# Patient Record
Sex: Female | Born: 1935 | Race: White | Hispanic: No | Marital: Married
Health system: Southern US, Community
[De-identification: ages and names within clinical notes are randomized; demographics above are authoritative.]

---

## 1998-12-24 ENCOUNTER — Other Ambulatory Visit: Admission: RE | Admit: 1998-12-24 | Discharge: 1998-12-24 | Payer: Self-pay | Admitting: Gynecology

## 1999-01-02 ENCOUNTER — Other Ambulatory Visit: Admission: RE | Admit: 1999-01-02 | Discharge: 1999-01-02 | Payer: Self-pay | Admitting: Gynecology

## 2002-03-08 ENCOUNTER — Encounter: Payer: Self-pay | Admitting: Orthopedic Surgery

## 2002-03-08 ENCOUNTER — Encounter: Admission: RE | Admit: 2002-03-08 | Discharge: 2002-03-08 | Payer: Self-pay | Admitting: Orthopedic Surgery

## 2002-05-31 ENCOUNTER — Encounter: Payer: Self-pay | Admitting: Orthopedic Surgery

## 2002-06-06 ENCOUNTER — Encounter: Payer: Self-pay | Admitting: Orthopedic Surgery

## 2002-06-06 ENCOUNTER — Inpatient Hospital Stay (HOSPITAL_COMMUNITY): Admission: RE | Admit: 2002-06-06 | Discharge: 2002-06-09 | Payer: Self-pay | Admitting: Orthopedic Surgery

## 2002-06-09 ENCOUNTER — Inpatient Hospital Stay (HOSPITAL_COMMUNITY)
Admission: RE | Admit: 2002-06-09 | Discharge: 2002-06-15 | Payer: Self-pay | Admitting: Physical Medicine & Rehabilitation

## 2009-08-08 ENCOUNTER — Ambulatory Visit (HOSPITAL_BASED_OUTPATIENT_CLINIC_OR_DEPARTMENT_OTHER): Admission: RE | Admit: 2009-08-08 | Discharge: 2009-08-08 | Payer: Self-pay | Admitting: Orthopedic Surgery

## 2009-08-08 ENCOUNTER — Encounter (INDEPENDENT_AMBULATORY_CARE_PROVIDER_SITE_OTHER): Payer: Self-pay | Admitting: Orthopedic Surgery

## 2011-02-14 LAB — POCT I-STAT, CHEM 8
BUN: 27 mg/dL — ABNORMAL HIGH (ref 6–23)
Calcium, Ion: 1.19 mmol/L (ref 1.12–1.32)
Chloride: 98 mEq/L (ref 96–112)
Creatinine, Ser: 1 mg/dL (ref 0.4–1.2)
Glucose, Bld: 113 mg/dL — ABNORMAL HIGH (ref 70–99)
HCT: 37 % (ref 36.0–46.0)
Hemoglobin: 12.6 g/dL (ref 12.0–15.0)
Potassium: 4.7 mEq/L (ref 3.5–5.1)
Sodium: 129 mEq/L — ABNORMAL LOW (ref 135–145)
TCO2: 25 mmol/L (ref 0–100)

## 2011-03-28 NOTE — Op Note (Signed)
Erin Conner, Erin Conner                          ACCOUNT NO.:  [id number]   MEDICAL RECORD NO.:  [id number]                   PATIENT TYPE:  INP   LOCATION:  5039                                 FACILITY:  [facility]   PHYSICIAN:  Loreta Ave, M.D.              DATE OF BIRTH:  06-04-1926   DATE OF PROCEDURE:  06/06/2002  DATE OF DISCHARGE:  06/09/2002                                 OPERATIVE REPORT   PREOPERATIVE DIAGNOSIS:  End stage degenerative arthritis of right hip.   POSTOPERATIVE DIAGNOSIS:  End stage degenerative arthritis of right hip.   OPERATION:  Right total hip replacement, Osteonics prosthesis, Press-Fit 54  mm acetabular component with screw fixation times two and 10-degree  polyethylene insert, cemented #7 femoral component, Ion plus type, 127  degree neck angle, -3 x 28 mm head, 12 mm distal spacer.   SURGEON:  Loreta Ave, M.D.   ASSISTANT:  Arlys John D. Petrarca, P.A.-C.   ANESTHESIA:  General.   ESTIMATED BLOOD LOSS:  Less than 150 cc.   BLOOD REPLACED:  None.   SPECIMENS:  Excised bone and soft tissue.   CULTURES:  None.   COMPLICATIONS:  None.   DRESSING:  Self compressive with abduction pillow.   DESCRIPTION OF PROCEDURE:  The patient brought to the operating room and  placed on the operating table in supine position.  After adequate anesthesia  had been obtained, turned to a lateral position, right side up.  Prepped and  draped in usual sterile fashion.  Leg lengths assessed.  Right being 5 to 6  mm longer than the left.  Approached with a  longitudinal incision over the  femur.  Extending posterior, superior from the trochanter.  Skin and  subcutaneous tissue divided.  Hemostasis with cautery.  Iliotibial band  exposed and incised with Charnley retractor put in place.   Neurovascular structures identified, protected, retracted.  External rotator  and capsule taken down off the back of the intertrochanteric groove on the  femur.  Hip  dislocated.  Grade 4 changes throughout.  Femoral head removed  with a saw one finger breadth above the lesser trochanter in line with the  definitive component.  Acetabulum exposed.  Grade 4 changes.  Soft tissue  debrided.  Sequential reaming up to a nice bleeding bony bed.  Sized for a  54 mm acetabulum.  Hammered in place at 45 degrees of abduction and 20 of  anteversion.   Fixation augmented with a 20 mm screw above and a 16 mm screw  posterosuperior.  Polyethylene insert attached with a 10 degree overhang  placed posterosuperior.  Retractors removed.  Femur exposed.  Sequential  reaming with power and handheld reamers up to good fit both proximally and  distally for a #7 component.  With the #7 component in place restoring  normal anteversion and with 127 degree neck angle, the hip was reduced with  excellent stability and restoration of leg lengths with the -3 ball portion.   All trials removed.  Copious irrigation.  Cement restrictor placed 2 cm  distal to the femoral tip.  After a pulse lavage, cement prepared and placed  with pressurized technique down into the femur.  The femoral component  assembled with a 12 mm distal tip cemented at the base.  It was then pressed  down into the femoral shaft restoring normal anteversion and held in place  until the cement had hardened.  Once complete, a series of trials of femoral  head was then undertaken.  Chose the -3 x 28 mm head, which gave good  restoration of leg lengths, as well as excellent stability and flexion and  extension.   Definitive head was attached to the femur, and then it was reduced.  Wound  irrigated.  External rotator and capsule repaired to the back to the  intertrochanteric groove with #1 Ethibond passed through drill holes and  tied over a bony bridge.  Retractor removed.  Iliotibial band closed with #1  Vicryl, skin and subcutaneous tissue with Vicryl, and then staples.  Margin  of wound injected with Marcaine.   Sterile compressive dressing applied after  the patient had Marcaine injected at the margin of the wound.  Returned to  supine position.  Abduction pillow placed.  Anesthesia reversed.  Brought to  recovery room.  Tolerated surgery well.  No complications.                                               Loreta Ave, M.D.    DFM/MEDQ  D:  06/06/2002  T:  06/10/2002  Job:  [id number]   cc:   Loreta Ave, M.D.

## 2011-03-28 NOTE — Discharge Summary (Signed)
NAMEJENNYLEE, Erin Conner                          ACCOUNT NO.:  [id number]   MEDICAL RECORD NO.:  [id number]                   PATIENT TYPE:  IPS   LOCATION:  4155                                 FACILITY:  [facility]   PHYSICIAN:  Ranelle Oyster, M.D.             DATE OF BIRTH:  January 26, 1926   DATE OF ADMISSION:  06/09/2002  DATE OF DISCHARGE:  06/15/2002                                 DISCHARGE SUMMARY   ADMISSION DIAGNOSES:  Advanced degenerative joint disease of the right hip.   DISCHARGE DIAGNOSES:  1. Advanced degenerative joint disease of the right hip.  2. History of asthma.  3. History of hypertension.   PROCEDURE PERFORMED:  Right total hip replacement.   HISTORY OF PRESENT ILLNESS:  The patient is a very pleasant, 77 year old  female with persistent, unrelenting, right groin pain.  She is now having  pain to the point where she not only has pain with ambulation but also has  nighttime pain.  She has failed conservative treatment.  She is now  indicated for a right total hip replacement.   HOSPITAL COURSE:  The patient is a 77 year old female admitted June 06, 2002, after appropriate laboratory studies were obtained as well as 1 gm of  vancomycin IV on call to the operating room, she was taken to the operating  room where she underwent a right total hip replacement.  She tolerated the  procedure well.  She was placed on a PCA Dilaudid pump for postoperative  pain control.  Heparin 5000 units subcu q.12h. until the Coumadin became  therapeutic.  Consultations with physical therapy, occupational therapy and  rehabilitation medicine were made.  Physical therapy was consulted and  allowed for weightbearing as tolerated on the right.  Postoperative x-ray  was obtained of the right hip postoperative.  She had difficulty with  hypokalemia and her IV was changed to normal saline with 20 mEq of KCl.  Her  hydrochlorothiazide was discontinued on July 29 and she was fluid  restricted  to 1000 cc/day.  She also had a magnesium performed.  She improved over her  hospital course.  She was transferred to rehabilitation on July 31 where she  will continue with her physical therapy and occupational therapy.  She was  discharged in improved condition.  EKG showed normal sinus rhythm with sinus  arrhythmia, low-voltage QRS, septal infarction, age indeterminate.  This was  May 31, 2002.  Radiographic studies of June 06, 2002, revealed a right  total hip prosthesis without radiographic evidence of complicating feature.   LABORATORY STUDIES:  Preoperative hemoglobin 12.1, hematocrit 36.4%, white  blood cell count 11,800, platelet count 389,000.  Discharge hemoglobin 9.5,  hematocrit 28.7%.  Admission sodium was 131, potassium 4.2, chloride 94, CO2  26, glucose 101, BUN 12, creatinine 1.1, calcium 9.8, AST 33, ALT 30,  alkaline phosphatase 66, total bilirubin 0.4.  Discharge sodium 133,  potassium  3.8, chloride 96, CO2 28, glucose 147, BUN 9, creatinine 0.8,  calcium 9.0.  Magnesium level on July 29 was 1.3.  Urinalysis was benign for  a voided urine.  Blood type was A negative, antibody screen negative.   DISCHARGE INSTRUCTIONS:  The patient will be discharged to rehabilitation  where she will continue with her physical therapy and occupational therapy.    CONDITION ON DISCHARGE:  She is discharged in improved condition.   FOLLOWUP APPOINTMENTS:  The patient is to follow up back up in our office in  two weeks postoperatively.     Oris Drone Santiago Bumpers, P.A.-C.                Ranelle Oyster, M.D.    BDP/MEDQ  D:  07/17/2002  T:  07/18/2002  Job:  [id number]

## 2011-03-28 NOTE — Discharge Summary (Signed)
NAMEJEWELIA, BOCCHINO                          ACCOUNT NO.:  [id number]   MEDICAL RECORD NO.:  [id number]                   PATIENT TYPE:  IPS   LOCATION:  4155                                 FACILITY:  [facility]   PHYSICIAN:  Dian Situ, P.A.    DATE OF BIRTH:  1926-04-27   DATE OF ADMISSION:  06/09/2002  DATE OF DISCHARGE:  06/15/2002                                 DISCHARGE SUMMARY   DISCHARGE DIAGNOSES:  1. Total hip replacement.  2. Hypertension.  3. Hypernatremia, resolved.  4. Postoperative anemia.   HISTORY OF PRESENT ILLNESS:  Erin Conner is a 77 year old female with  hypertension and DJD of right hip with pain, elected to undergo right total  hip replacement 06/07/2002 by Dr. Eulah Pont.  Postop was weightbearing tolerated  and on Coumadin for DVT prophylaxis.  Issues past surgery  have included  postop anemia as well as worsening of chronic hypernatremia.  She was placed  on fluid restriction an Hydrochlorothiazide was being held.  Physical  therapy was initiated.  The patient currently close supervision for  transfers, close supervision for ambulating 20 feet with standard walker.  Rehabilitation consulted for progressive therapies.   PAST MEDICAL HISTORY:  Significant for hypertension, DJD, GERD,  appendectomy, asthma, insomnia, and excision of cataracts.   ALLERGIES:  PENICILLIN.   SOCIAL HISTORY:  The patient is married and lives in one-level home with one  to two steps at entry, was independent prior to admission.  She does not use  any alcohol or tobacco.  Has supportive family.   HOSPITAL COURSE:  Ms. Jamesha Ellsworth was admitted to rehabilitation on  06/09/2002 for inpatient therapies to consist of PT and OT.  Past admission,  she was maintained on Coumadin for DVT prophylaxis.  Coumadin at time of  discharge was therapeutic at 23.4 and 2.4, and patient to continue on  Coumadin through 06/17/2002 to complete a DVT prophylaxis course.  [facility]  to adjust and follow Coumadin past discharge.   Followup labs were done past admission showing hemoglobin 8.6, hematocrit  25.5.  Repeat showed some improvement with hemoglobin 9.4, hematocrit 28.2.  Sodium at time of admission was 132 on preoperative check.  As patient's  blood pressure was borderline, hydrochlorothiazide was resumed.  Followup  electrolytes on August 4 showed sodium 135, potassium 4.1, chloride 96, BUN  17, creatinine 0.8, glucose 176.  UA sent off was negative.  Urine cultures  showed insignificant growth of 6000 colonies.  The patient's right hip  incision healed well without any signs or symptoms of infection noted or  drainage or erythema noted.  Staples remained intact, and the patient to  follow up with Dr. Eulah Pont in five to seven days for staple removal.   During stay on rehabilitation, Ms. Testerman progressed to being modified  independent for transfers, modified independent to ambulate 150 feet on  level surfaces.  She is modified independent for navigating 12 steps.  The  patient was at modified independent level for advanced ADLs.  The patient  will continue to receive followup home health PT by [facility] past  discharge.  On 06/15/2002, the patient is discharged to home   DISCHARGE MEDICATIONS:  1. Coumadin 3 mg p.o. q.d.  2. Premarin 0.625 mg q.d.  3. Calcium 1 t.i.d.  4. Oxycodone IR 1 to 2 p.o. q.4-6h. p.r.n. pain.  5. Micardis 80 mg a day.  6. Hydrochlorothiazide 25 mg per day.   ACTIVITY:  Right total hip precautions, use walker.   WOUND CARE:  Keep clean and dry.   SPECIAL INSTRUCTIONS:  No alcohol, no smoking, no driving.  No aspirin or  aspirin products while on Coumadin.  Home health to monitor pro time on  06/17/2002.  [facility] to monitor Coumadin  through 07/07/2002.   FOLLOW UP:  The patient is to follow up with Dr. Eulah Pont in five to seven  days.  Follow up with Dr. Riley Kill as needed.                                                Dian Situ, P.A.    PP/MEDQ  D:  06/15/2002  T:  06/20/2002  Job:  [id number]   cc:   Gwen Pounds, M.D.   Loreta Ave, M.D.

## 2013-09-08 ENCOUNTER — Telehealth: Payer: Self-pay | Admitting: Oncology

## 2013-09-08 NOTE — Telephone Encounter (Signed)
C/D 09/08/13 for appt. 09/27/13

## 2013-09-23 ENCOUNTER — Other Ambulatory Visit: Payer: Self-pay | Admitting: Oncology

## 2013-09-23 DIAGNOSIS — D729 Disorder of white blood cells, unspecified: Secondary | ICD-10-CM

## 2013-09-27 ENCOUNTER — Ambulatory Visit: Payer: Medicare Other

## 2013-09-27 ENCOUNTER — Ambulatory Visit (HOSPITAL_COMMUNITY)
Admission: RE | Admit: 2013-09-27 | Discharge: 2013-09-27 | Disposition: A | Discharge status: Home / Self Care | Payer: Medicare Other | Source: Ambulatory Visit | Attending: Oncology | Admitting: Oncology

## 2013-09-27 ENCOUNTER — Other Ambulatory Visit (HOSPITAL_BASED_OUTPATIENT_CLINIC_OR_DEPARTMENT_OTHER): Payer: Medicare Other | Admitting: Lab

## 2013-09-27 ENCOUNTER — Encounter (INDEPENDENT_AMBULATORY_CARE_PROVIDER_SITE_OTHER): Payer: Self-pay

## 2013-09-27 ENCOUNTER — Telehealth: Payer: Self-pay | Admitting: Oncology

## 2013-09-27 ENCOUNTER — Ambulatory Visit (HOSPITAL_BASED_OUTPATIENT_CLINIC_OR_DEPARTMENT_OTHER): Payer: Medicare Other | Admitting: Oncology

## 2013-09-27 VITALS — BP 132/59 | HR 82 | Temp 97.0°F | Resp 20 | Ht 66.0 in | Wt 177.7 lb

## 2013-09-27 DIAGNOSIS — D7289 Other specified disorders of white blood cells: Secondary | ICD-10-CM

## 2013-09-27 DIAGNOSIS — D472 Monoclonal gammopathy: Secondary | ICD-10-CM

## 2013-09-27 DIAGNOSIS — M47812 Spondylosis without myelopathy or radiculopathy, cervical region: Secondary | ICD-10-CM | POA: Insufficient documentation

## 2013-09-27 DIAGNOSIS — N289 Disorder of kidney and ureter, unspecified: Secondary | ICD-10-CM

## 2013-09-27 DIAGNOSIS — I6529 Occlusion and stenosis of unspecified carotid artery: Secondary | ICD-10-CM | POA: Insufficient documentation

## 2013-09-27 DIAGNOSIS — M47817 Spondylosis without myelopathy or radiculopathy, lumbosacral region: Secondary | ICD-10-CM | POA: Insufficient documentation

## 2013-09-27 DIAGNOSIS — I658 Occlusion and stenosis of other precerebral arteries: Secondary | ICD-10-CM | POA: Insufficient documentation

## 2013-09-27 DIAGNOSIS — D729 Disorder of white blood cells, unspecified: Secondary | ICD-10-CM

## 2013-09-27 LAB — CBC WITH DIFFERENTIAL/PLATELET
BASO%: 1.7 % (ref 0.0–2.0)
EOS%: 5.6 % (ref 0.0–7.0)
HCT: 35.8 % (ref 34.8–46.6)
MCHC: 33.2 g/dL (ref 31.5–36.0)
MONO#: 1.5 10*3/uL — ABNORMAL HIGH (ref 0.1–0.9)
NEUT#: 7.2 10*3/uL — ABNORMAL HIGH (ref 1.5–6.5)
NEUT%: 61.1 % (ref 38.4–76.8)
Platelets: 321 10*3/uL (ref 145–400)
RBC: 3.88 10*6/uL (ref 3.70–5.45)
RDW: 13.3 % (ref 11.2–14.5)
WBC: 11.8 10*3/uL — ABNORMAL HIGH (ref 3.9–10.3)
lymph#: 2.3 10*3/uL (ref 0.9–3.3)

## 2013-09-27 LAB — COMPREHENSIVE METABOLIC PANEL (CC13)
ALT: 14 U/L (ref 0–55)
AST: 18 U/L (ref 5–34)
Albumin: 3.1 g/dL — ABNORMAL LOW (ref 3.5–5.0)
Alkaline Phosphatase: 112 U/L (ref 40–150)
Anion Gap: 10 mEq/L (ref 3–11)
CO2: 23 mEq/L (ref 22–29)
Calcium: 10.4 mg/dL (ref 8.4–10.4)
Chloride: 104 mEq/L (ref 98–109)
Potassium: 4 mEq/L (ref 3.5–5.1)
Sodium: 137 mEq/L (ref 136–145)
Total Protein: 7.3 g/dL (ref 6.4–8.3)

## 2013-09-27 NOTE — Progress Notes (Signed)
Note dictated

## 2013-09-27 NOTE — Telephone Encounter (Signed)
Appts made per 11/18 POF AVS and CAL to pt shh

## 2013-09-30 LAB — SPEP & IFE WITH QIG
Alpha-1-Globulin: 5.5 % — ABNORMAL HIGH (ref 2.9–4.9)
Alpha-2-Globulin: 14.1 % — ABNORMAL HIGH (ref 7.1–11.8)
Beta 2: 5 % (ref 3.2–6.5)
Gamma Globulin: 13.5 % (ref 11.1–18.8)
Total Protein, Serum Electrophoresis: 6.4 g/dL (ref 6.0–8.3)

## 2013-09-30 LAB — KAPPA/LAMBDA LIGHT CHAINS: Kappa:Lambda Ratio: 1.24 (ref 0.26–1.65)

## 2013-10-18 NOTE — Progress Notes (Signed)
CC:   Erin Conner. Erin Conner, Fax [phone] Erin Conner. Erin Conner, M.D.  REFERRING PHYSICIAN:  James L. Erin Conner, M.D.  REASON FOR CONSULTATION:  Evaluation for plasma cell disorder.  HISTORY OF PRESENT ILLNESS:  This is a pleasant 79 year old woman, native of [place] [facility], where she lived the majority of her life. She is currently retired and lives with her husband and continued to be in relatively good health.  She does have significant past medical history significant for longstanding hypertension and coronary artery disease, but despite that, she has not had any recent decline in her health.  She was noted by her primary care physician, increase in her creatinine from her baseline to creatinine ranged between 1.8 to 2.1. She was referred to Dr. Fayrene Fearing Erin Conner at [facility] and his workup included serum protein electrophoresis, which showed an M- spike that is 0.2 g/dL.  She had normal quantitative immunoglobulins of IgG, IgA, and IgM.  Immunofixation showed an IgG lambda.  Her protein urine electrophoresis showed no M-spike.  Her iron studies all within normal range.  Her calcium was slightly elevated at 10.9.  Her total protein was normal at 7, albumin was normal at 4.0, and her creatinine was 2.13 with her electronic GFR at 21 mL/min.  For that reason, the patient was referred to me for evaluation for possible plasma cell disorder.  Clinically, she is asymptomatic.  She does not report any opportunistic infections, does not report any pathological fractures, does not report any peripheral neuropathy.  She does not report any neurological symptoms, did not report any headaches, blurred vision, double vision, alteration of mental status, psychiatric issues, depression.  Does not report any weight loss, appetite changes, or any constitutional symptoms.  Does not report any chest pain, orthopnea, PND, or palpitation.  Does not report any cough, hemoptysis  or hematemesis.  Does not report any nausea, vomiting, or abdominal pain. Does not report any constipation or diarrhea.  Does not report any frequency, urgency, or hesitancy.  Does not report any musculoskeletal complaints.  No arthralgias, myalgias.  No heat or cold intolerance.  No bleeding, clotting diathesis.  Rest of review of systems is unremarkable.  PAST MEDICAL HISTORY:  Significant for hypertension, history of atrial fibrillation, coronary artery disease, and renal insufficiency.  She has history of hyperlipidemia.  MEDICATIONS:  She is on amlodipine, Eliquis, aspirin, atenolol, furosemide, levothyroxine, losartan, montelukast, pantoprazole, potassium supplement, fish oil, multivitamin, Nasonex, and Dulera.  ALLERGIES:  Allergies to penicillin.  She also had adverse reactions to Restoril and Ambien.  SOCIAL HISTORY:  She is married.  She has a daughter.  Denied any alcohol or tobacco abuse.  Currently retired.  FAMILY HISTORY:  Does not report any history of any blood disorders or any malignancies.  PAST SURGICAL HISTORY:  She is status post hysterectomy, cholecystectomy, and hip replacement among other orthopedic procedures as she had in the past.  PHYSICAL EXAMINATION:  General:  Alert, awake, pleasant woman, appeared in no active distress.  Vital Signs:  Blood pressure is 132/59, pulse 82, respirations 20, temperature is 97, weight is 177 pounds.  HEENT: Head is normocephalic, atraumatic.  Pupils are equal, round, and reactive to light.  Oral mucosa moist and pink.  Neck:  Supple without lymphadenopathy.  Heart:  Regular rate, S1 and S2.  Lungs:  Clear to auscultation.  No rhonchi, wheeze, dullness to percussion.  Abdomen: Soft, nontender.  No hepatosplenomegaly.  Extremities:  No clubbing, cyanosis, or edema.  Neurologic:  Intact motor,  sensory and deep tendon reflexes.  LABORATORY DATA:  Showed a hemoglobin of 11.9, white cell count of 11.8, platelet count 321.   Chemistries and serum protein electrophoresis are currently pending.  ASSESSMENT AND PLAN:  This is a pleasant 79 year old woman with the following issues: 1. IgG lambda monoclonal protein, presented with an M-spike of 0.2     g/dL with normal quantitative immunoglobulins.  Her urine     electrophoresis did not report any abnormalities and her serum     light chains are pending from today.  The differential diagnosis     was discussed today with the patient and her daughter that would     include a plasma cell disorder such as monoclonal gammopathy of     undetermined significance, multiple myeloma, and less likely     amyloidosis.  I see no clear-cut end organ damage at this time.  It     is very possible that she could have lytic bony lesions and for     that reason, I am obtaining a skeletal survey to rule that out.  If     that is negative, then probably we are dealing with a monoclonal     gammopathy of undetermined significance rather than multiple     myeloma.  I have explained to her the natural course of this     disease and possible need for continued observation.  Clearly if     she has bone lesions, then she will require a bone marrow biopsy     and evaluation for treatment of her multiple myeloma.  I feel that     her M-spike is rather low and is probably an incidental finding. 2. Renal insufficiency.  I doubt this is related to plasma cell     disorder, longstanding hypertension and nephrosclerosis.  She is     following up with Nephrology regarding that.  All her questions     were answered today.    ______________________________ Benjiman Core, M.D. FNS/MEDQ  D:  09/27/2013  T:  09/28/2013  Job:  [id number]

## 2014-03-28 ENCOUNTER — Other Ambulatory Visit: Payer: Medicare Other

## 2014-03-28 ENCOUNTER — Ambulatory Visit: Payer: Medicare Other | Admitting: Oncology

## 2014-04-19 ENCOUNTER — Telehealth: Payer: Self-pay | Admitting: Oncology

## 2014-04-19 NOTE — Telephone Encounter (Signed)
received vm from gloria at dr deterding's office requesting appt for pt. pt missed 5/19 appts. r/s appts and s/w pt today re appts for 6/17 and mailed schedule. intiilally called cell due to home number busy. lm on cell but was able to reach pt at home re appt.

## 2014-04-26 ENCOUNTER — Other Ambulatory Visit (HOSPITAL_BASED_OUTPATIENT_CLINIC_OR_DEPARTMENT_OTHER): Payer: Medicare Other

## 2014-04-26 ENCOUNTER — Ambulatory Visit (HOSPITAL_BASED_OUTPATIENT_CLINIC_OR_DEPARTMENT_OTHER): Payer: Medicare Other | Admitting: Oncology

## 2014-04-26 ENCOUNTER — Telehealth: Payer: Self-pay | Admitting: Oncology

## 2014-04-26 ENCOUNTER — Encounter: Payer: Self-pay | Admitting: Oncology

## 2014-04-26 VITALS — BP 153/50 | HR 71 | Temp 97.8°F | Resp 18 | Ht 66.0 in | Wt 167.1 lb

## 2014-04-26 DIAGNOSIS — D7289 Other specified disorders of white blood cells: Secondary | ICD-10-CM

## 2014-04-26 DIAGNOSIS — N189 Chronic kidney disease, unspecified: Secondary | ICD-10-CM

## 2014-04-26 DIAGNOSIS — D472 Monoclonal gammopathy: Secondary | ICD-10-CM

## 2014-04-26 DIAGNOSIS — D729 Disorder of white blood cells, unspecified: Secondary | ICD-10-CM

## 2014-04-26 LAB — COMPREHENSIVE METABOLIC PANEL (CC13)
ALT: 15 U/L (ref 0–55)
ANION GAP: 10 meq/L (ref 3–11)
AST: 22 U/L (ref 5–34)
Albumin: 3.1 g/dL — ABNORMAL LOW (ref 3.5–5.0)
Alkaline Phosphatase: 113 U/L (ref 40–150)
BUN: 31 mg/dL — ABNORMAL HIGH (ref 7.0–26.0)
CALCIUM: 10.4 mg/dL (ref 8.4–10.4)
CO2: 23 meq/L (ref 22–29)
CREATININE: 1.7 mg/dL — AB (ref 0.6–1.1)
Chloride: 109 mEq/L (ref 98–109)
GLUCOSE: 165 mg/dL — AB (ref 70–140)
Potassium: 4.1 mEq/L (ref 3.5–5.1)
Sodium: 142 mEq/L (ref 136–145)
Total Bilirubin: 0.52 mg/dL (ref 0.20–1.20)
Total Protein: 6.8 g/dL (ref 6.4–8.3)

## 2014-04-26 LAB — CBC WITH DIFFERENTIAL/PLATELET
BASO%: 1.5 % (ref 0.0–2.0)
BASOS ABS: 0.1 10*3/uL (ref 0.0–0.1)
EOS ABS: 0.8 10*3/uL — AB (ref 0.0–0.5)
EOS%: 8.2 % — ABNORMAL HIGH (ref 0.0–7.0)
HEMATOCRIT: 40.1 % (ref 34.8–46.6)
HEMOGLOBIN: 12.8 g/dL (ref 11.6–15.9)
LYMPH#: 1.8 10*3/uL (ref 0.9–3.3)
LYMPH%: 18.5 % (ref 14.0–49.7)
MCH: 29.3 pg (ref 25.1–34.0)
MCHC: 32.1 g/dL (ref 31.5–36.0)
MCV: 91.6 fL (ref 79.5–101.0)
MONO#: 1 10*3/uL — AB (ref 0.1–0.9)
MONO%: 10.7 % (ref 0.0–14.0)
NEUT#: 5.8 10*3/uL (ref 1.5–6.5)
NEUT%: 61.1 % (ref 38.4–76.8)
Platelets: 262 10*3/uL (ref 145–400)
RBC: 4.37 10*6/uL (ref 3.70–5.45)
RDW: 14.1 % (ref 11.2–14.5)
WBC: 9.5 10*3/uL (ref 3.9–10.3)

## 2014-04-26 NOTE — Progress Notes (Signed)
Hematology and Oncology Follow Up Visit  Erin Conner [id number] 1926-05-01 80 y.o. 04/26/2014 10:30 AM Erin Conner, Erin Conner, [place]   Principle Diagnosis: 80 year old woman with a monoclonal lambda light chain with a possible plasma cell disorder likely MGUS diagnosed in November of 2014.   Current therapy: Observation and surveillance.  Interim History:  Erin Conner presents today for a followup visit. He is a pleasant woman I saw in consultation back in November of 2014 for the evaluation for a possible plasma cell disorder. Her workup revealed a monoclonal lambda light chain without any heavy chain complement. Her M spike was not detected and her serum light chains showed an elevated kappa and lambda complement with a normal ratio. Her skeletal survey did not show any lytic bony lesions. She does have chronic renal insufficiency with a creatinine currently at baseline. He is asymptomatic at this point but she was hospitalized in May of 2015 for a pneumonia and urinary tract infection. Currently however she is asymptomatic. She does not report any headaches or blurry vision or double vision. Does not report any fevers or chills or sweats. This report any chest pain shortness of breath or cough or hemoptysis. Does not report any bone lesions or pathological fractures. Does not report any recurrent infections. Percent review of systems unremarkable.  Medications: I have reviewed the patient's current medications.  Current Outpatient Prescriptions  Medication Sig Dispense Refill  . apixaban (ELIQUIS) 2.5 MG TABS tablet Take 2.5 mg by mouth 2 (two) times daily.      Marland Kitchen aspirin 81 MG tablet Take 81 mg by mouth daily.      Marland Kitchen atenolol (TENORMIN) 25 MG tablet Take 12.5 mg by mouth daily.      Marland Kitchen desloratadine (CLARINEX) 5 MG tablet Take 5 mg by mouth daily.      Marland Kitchen donepezil (ARICEPT) 5 MG tablet Take 5 mg by mouth at bedtime.      . fish oil-omega-3 fatty acids 1000 MG capsule Take 1,200 mg by mouth  daily.      Marland Kitchen levothyroxine (SYNTHROID, LEVOTHROID) 75 MCG tablet Take 75 mcg by mouth daily before breakfast.      . losartan (COZAAR) 100 MG tablet Take 50 mg by mouth daily.       . magnesium oxide (MAG-OX) 400 MG tablet Take 400 mg by mouth daily.      . montelukast (SINGULAIR) 10 MG tablet Take 10 mg by mouth at bedtime.      . pantoprazole (PROTONIX) 40 MG tablet Take 40 mg by mouth 2 (two) times daily.       . potassium chloride (K-DUR) 10 MEQ tablet Take 10 mEq by mouth daily.       No current facility-administered medications for this visit.     Allergies:  Allergies  Allergen Reactions  . Ambien [Zolpidem]   . Penicillins   . Restoril [Temazepam]     Past Medical History, Surgical history, Social history, and Family History were reviewed and updated.   Physical Exam: Blood pressure 153/50, pulse 71, temperature 97.8 F (36.6 C), temperature source Oral, resp. rate 18, height 5\' 6"  (1.676 m), weight 167 lb 1.6 oz (75.796 kg). ECOG: 2 General appearance: alert Head: Normocephalic, without obvious abnormality Neck: no adenopathy Lymph nodes: Cervical, supraclavicular, and axillary nodes normal. Heart:regular rate and rhythm, S1, S2 normal, no murmur, click, rub or gallop Lung:chest clear, no wheezing, rales, normal symmetric air entry Abdomin: soft, non-tender, without masses or organomegaly EXT:no erythema, induration, or  nodules   Lab Results: Lab Results  Component Value Date   WBC 9.5 04/26/2014   HGB 12.8 04/26/2014   HCT 40.1 04/26/2014   MCV 91.6 04/26/2014   PLT 262 04/26/2014     Chemistry      Component Value Date/Time   NA 142 04/26/2014 0954   NA 129* 08/08/2009 0814   K 4.1 04/26/2014 0954   K 4.7 08/08/2009 0814   CL 98 08/08/2009 0814   CO2 23 04/26/2014 0954   BUN 31.0* 04/26/2014 0954   BUN 27* 08/08/2009 0814   CREATININE 1.7* 04/26/2014 0954   CREATININE 1.0 08/08/2009 0814      Component Value Date/Time   CALCIUM 10.4 04/26/2014 0954   ALKPHOS  113 04/26/2014 0954   AST 22 04/26/2014 0954   ALT 15 04/26/2014 0954   BILITOT 0.52 04/26/2014 0954       Impression and Plan:  80 year old woman with the following issues:  1. A plasma cell disorder possible MGUS manifesting itself with light chain disease detected by immunofixation. Her total kappa and lambda light chains are elevated with a normal ratio. She does have an elevated lambda light chain with immunofixation. I see no clear-cut evidence of end organ damage. She has a normal hemoglobin, normal skeletal survey and her creatinine is stable. I plan to continue active surveillance and repeat these studies in one year.  2. Chronic renal insufficiency: Her creatinine today is 1.7 which is improved from 2.0 back in 2014. I doubt her chronic renal insufficiency is related to a plasma cell disorder but will continue to monitor her clinical picture.  3. Followup: Will be in 12 months sooner if needed.    [facility], MD 6/17/201510:30 AM

## 2014-04-26 NOTE — Telephone Encounter (Signed)
gv and printed appt sched and avs for pt for June 2016 °

## 2014-04-28 LAB — SPEP & IFE WITH QIG
ALPHA-1-GLOBULIN: 5.3 % — AB (ref 2.9–4.9)
Albumin ELP: 55.7 % — ABNORMAL LOW (ref 55.8–66.1)
Alpha-2-Globulin: 11.9 % — ABNORMAL HIGH (ref 7.1–11.8)
BETA 2: 5 % (ref 3.2–6.5)
Beta Globulin: 7.4 % — ABNORMAL HIGH (ref 4.7–7.2)
GAMMA GLOBULIN: 14.7 % (ref 11.1–18.8)
IGA: 248 mg/dL (ref 69–380)
IGM, SERUM: 54 mg/dL (ref 52–322)
IgG (Immunoglobin G), Serum: 842 mg/dL (ref 690–1700)
Total Protein, Serum Electrophoresis: 6.2 g/dL (ref 6.0–8.3)

## 2014-04-28 LAB — KAPPA/LAMBDA LIGHT CHAINS
Kappa free light chain: 8.12 mg/dL — ABNORMAL HIGH (ref 0.33–1.94)
Kappa:Lambda Ratio: 1.99 — ABNORMAL HIGH (ref 0.26–1.65)
Lambda Free Lght Chn: 4.09 mg/dL — ABNORMAL HIGH (ref 0.57–2.63)

## 2015-04-25 ENCOUNTER — Other Ambulatory Visit: Payer: Medicare Other

## 2015-04-25 ENCOUNTER — Ambulatory Visit: Payer: Medicare Other | Admitting: Oncology

## 2015-07-02 IMAGING — CR DG BONE SURVEY MET
8 of 10 series · 8 of 10 positions shown · non-contrast
Comparison: None.

CLINICAL DATA: Monoclonal gammopathy of unknown significance.

EXAM:
METASTATIC BONE SURVEY

[w chest pa]
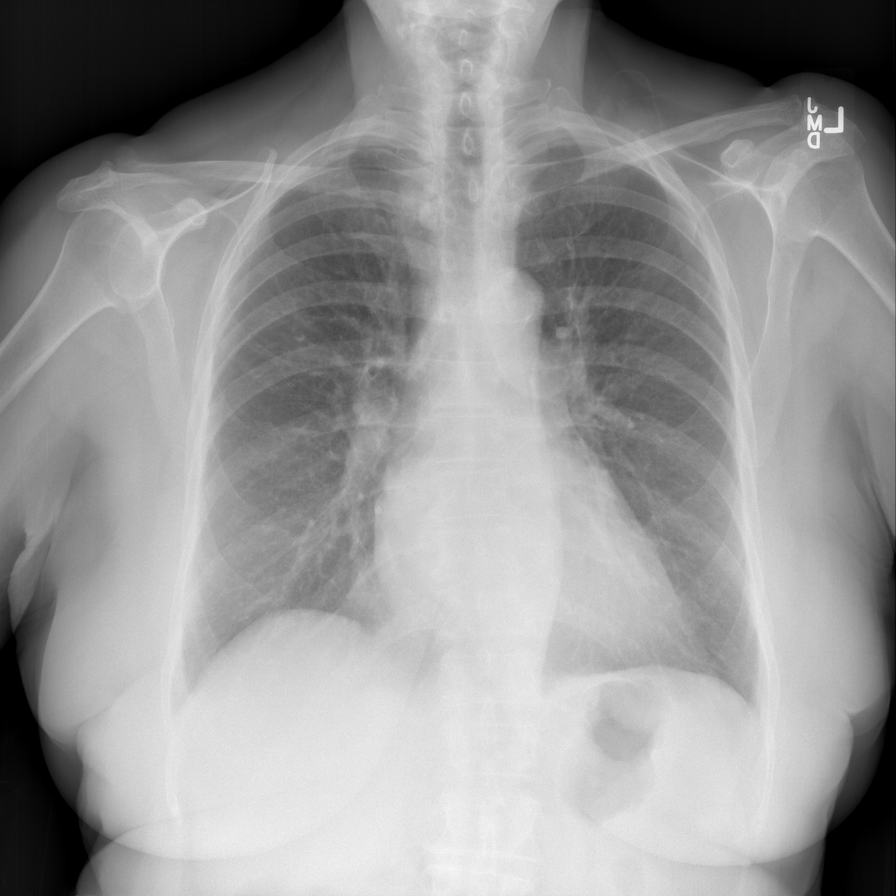

[w c-spine lat *]
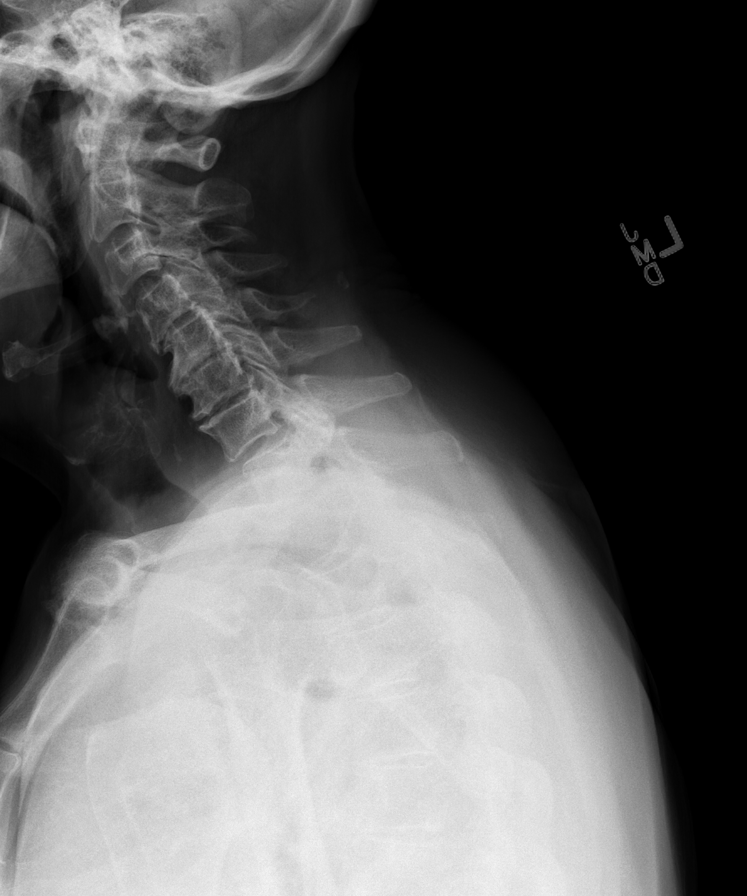

[w skull lat *]
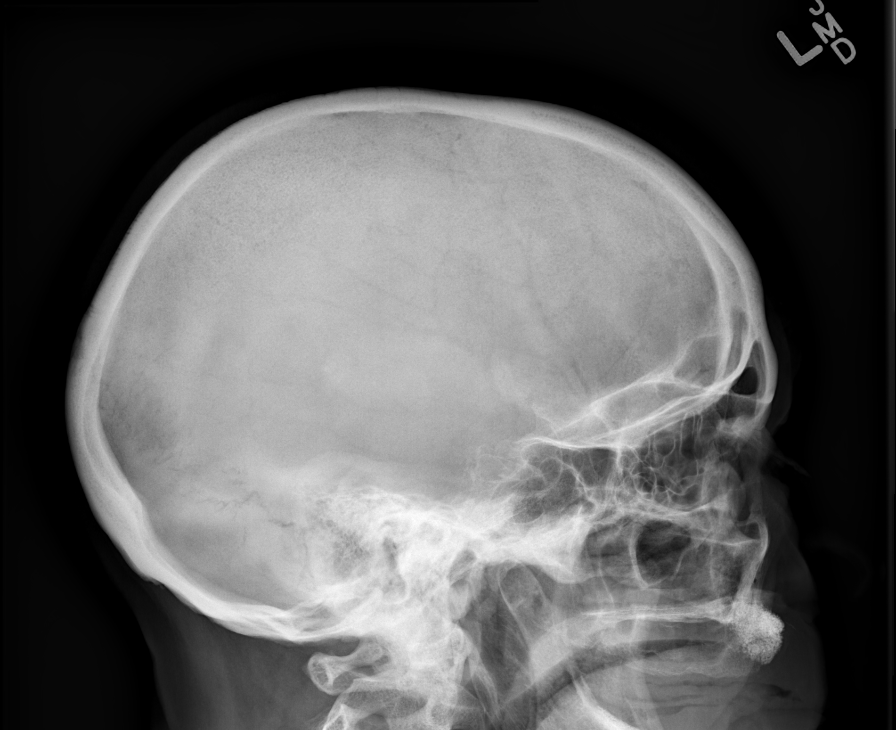

[w swimmers view]
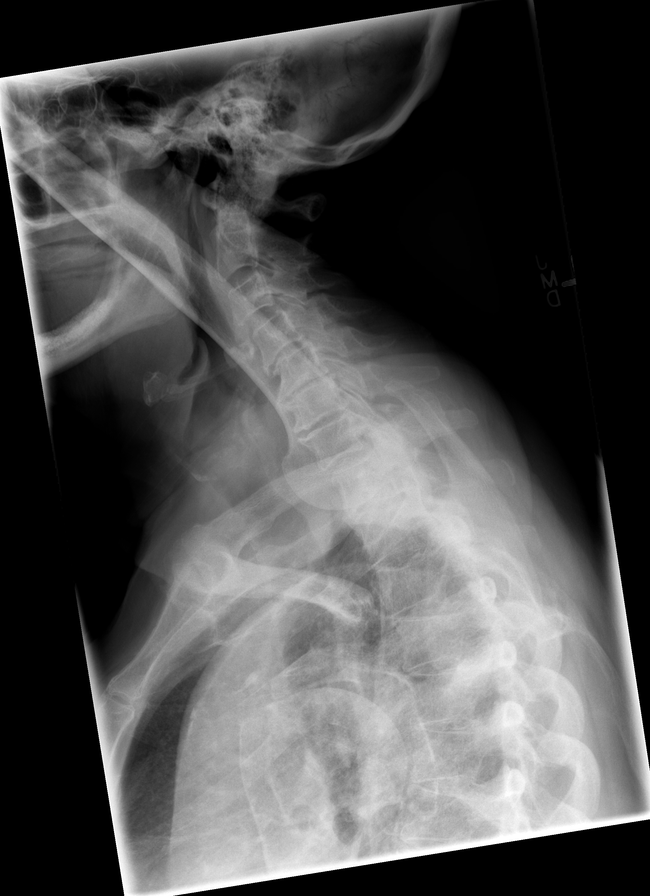

[w c-spine a.p.]
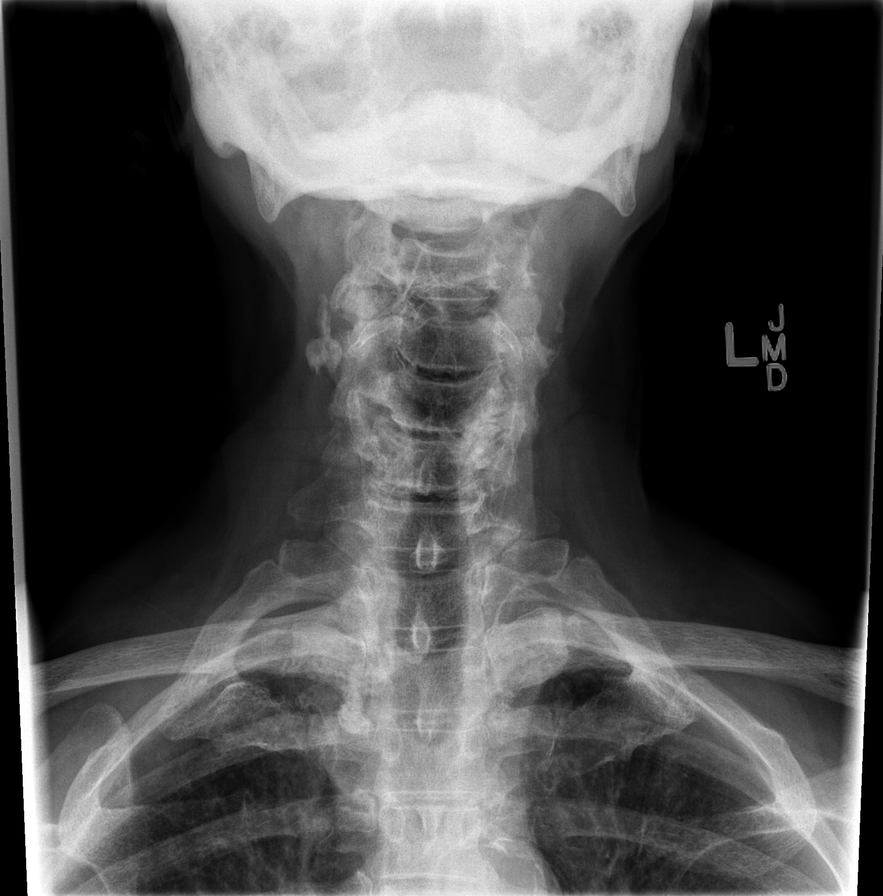

[w humerus ap left *]
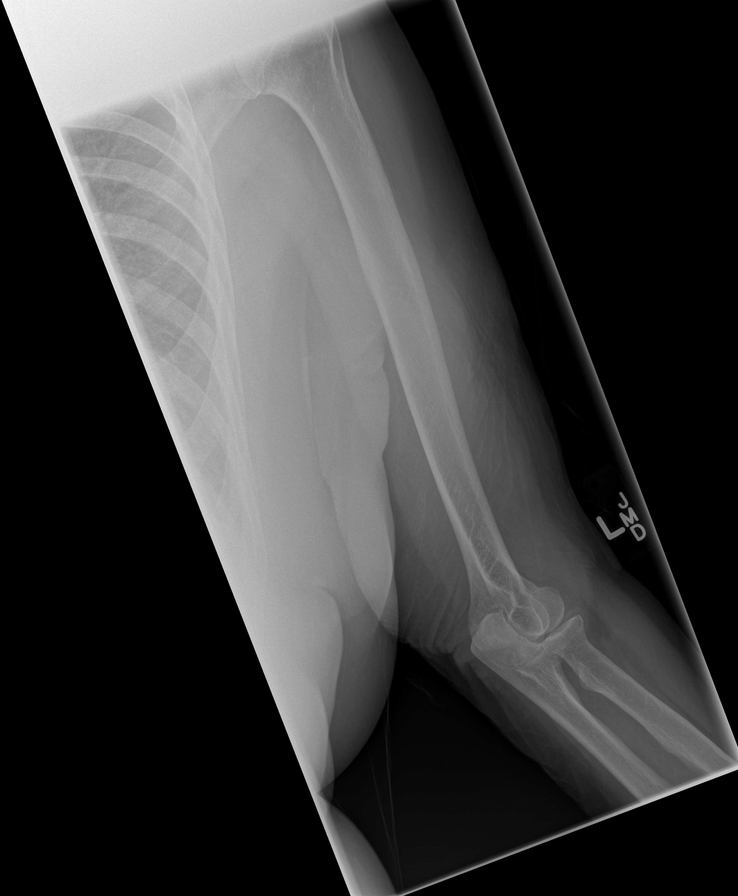

[w humerus ap right]
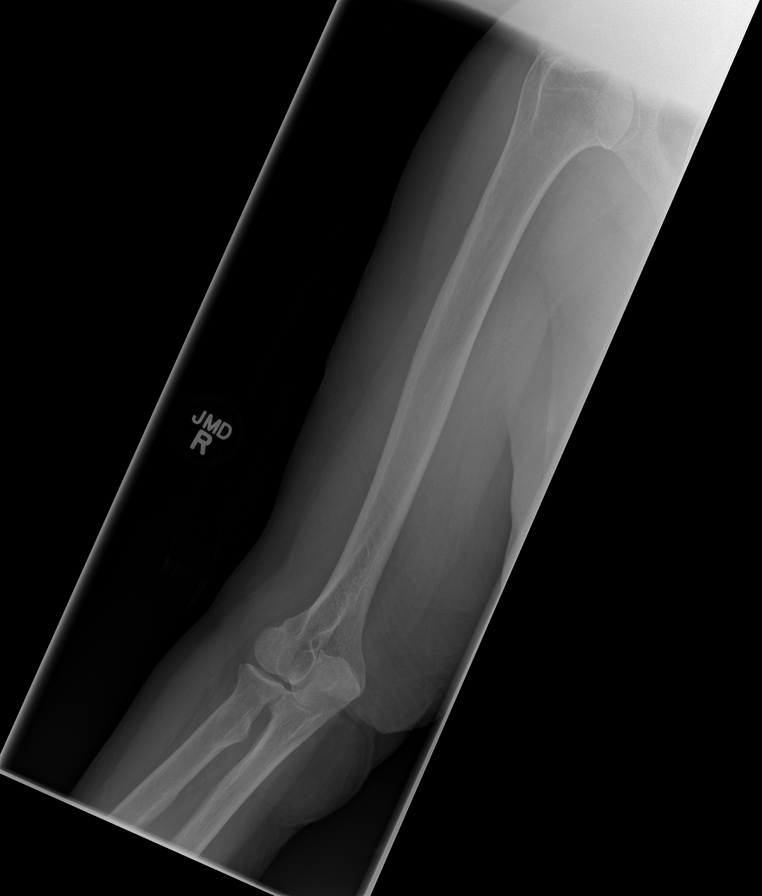

[t t-spine a.p.]
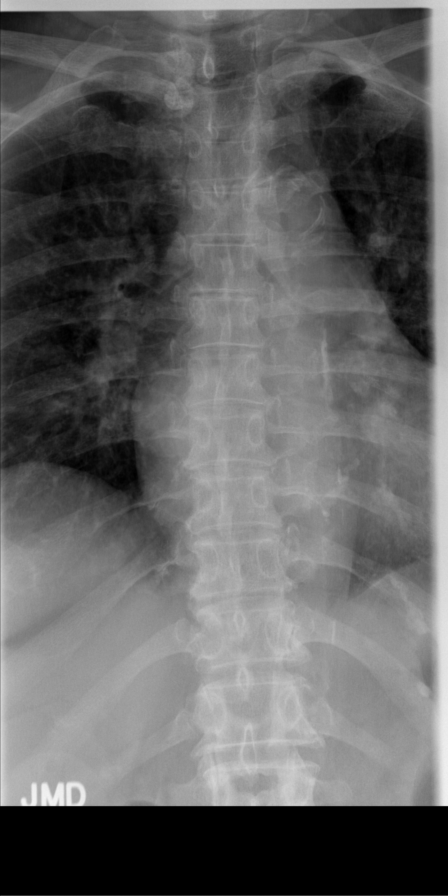

[8 of 10 positions shown; findings below may reference images not displayed]

FINDINGS: There are no lytic bone lesions to suggest multiple myeloma. There
are no osteoblastic lesions. There is no evidence of neoplastic
disease to bone.

There are significant degenerative changes of the spine, in the
cervical mostly along the lower cervical spine and in the lower
lumbar spine. In the neck, dense bilateral carotid calcifications
are noted.

There is a right hip prosthesis is well-seated and aligned.

The frontal chest radiograph shows no active disease.

The bones are diffusely demineralized.
IMPRESSION: No lytic bone lesions are seen to suggest multiple myeloma.

## 2015-09-11 DEATH — deceased
